# Patient Record
Sex: Male | Born: 1940 | Race: White | Hispanic: No | Marital: Married | Smoking: Never smoker
Health system: Southern US, Community
[De-identification: ages and names within clinical notes are randomized; demographics above are authoritative.]

## PROBLEM LIST (undated history)

## (undated) DIAGNOSIS — G4730 Sleep apnea, unspecified: Secondary | ICD-10-CM

## (undated) DIAGNOSIS — I10 Essential (primary) hypertension: Secondary | ICD-10-CM

## (undated) DIAGNOSIS — E669 Obesity, unspecified: Secondary | ICD-10-CM

## (undated) DIAGNOSIS — C801 Malignant (primary) neoplasm, unspecified: Secondary | ICD-10-CM

## (undated) DIAGNOSIS — E119 Type 2 diabetes mellitus without complications: Secondary | ICD-10-CM

## (undated) HISTORY — PX: KNEE SURGERY: SHX244

## (undated) HISTORY — PX: ADENOIDECTOMY: SUR15

## (undated) HISTORY — PX: PROSTATECTOMY: SHX69

---

## 2012-07-19 DIAGNOSIS — Z86018 Personal history of other benign neoplasm: Secondary | ICD-10-CM

## 2012-07-19 HISTORY — DX: Personal history of other benign neoplasm: Z86.018

## 2013-11-19 LAB — CBC WITH DIFFERENTIAL/PLATELET
Basophil #: 0.1 10*3/uL (ref 0.0–0.1)
Basophil %: 0.5 %
EOS ABS: 0.1 10*3/uL (ref 0.0–0.7)
Eosinophil %: 0.4 %
HCT: 45.5 % (ref 40.0–52.0)
HGB: 15.5 g/dL (ref 13.0–18.0)
Lymphocyte #: 2.2 10*3/uL (ref 1.0–3.6)
Lymphocyte %: 19.3 %
MCH: 32.5 pg (ref 26.0–34.0)
MCHC: 34 g/dL (ref 32.0–36.0)
MCV: 96 fL (ref 80–100)
Monocyte #: 1 x10 3/mm (ref 0.2–1.0)
Monocyte %: 8.3 %
Neutrophil #: 8.3 10*3/uL — ABNORMAL HIGH (ref 1.4–6.5)
Neutrophil %: 71.5 %
Platelet: 185 10*3/uL (ref 150–440)
RBC: 4.76 10*6/uL (ref 4.40–5.90)
RDW: 13.1 % (ref 11.5–14.5)
WBC: 11.6 10*3/uL — AB (ref 3.8–10.6)

## 2013-11-19 LAB — BASIC METABOLIC PANEL
Anion Gap: 8 (ref 7–16)
BUN: 12 mg/dL (ref 7–18)
CALCIUM: 8.8 mg/dL (ref 8.5–10.1)
CHLORIDE: 103 mmol/L (ref 98–107)
Co2: 29 mmol/L (ref 21–32)
Creatinine: 0.92 mg/dL (ref 0.60–1.30)
EGFR (African American): >60
EGFR (Non-African Amer.): >60
GLUCOSE: 147 mg/dL — AB (ref 65–99)
OSMOLALITY: 282 (ref 275–301)
Potassium: 3.3 mmol/L — ABNORMAL LOW (ref 3.5–5.1)
Sodium: 140 mmol/L (ref 136–145)

## 2013-11-19 LAB — URINALYSIS, COMPLETE
BACTERIA: NONE SEEN
Bilirubin,UR: NEGATIVE
Blood: NEGATIVE
Glucose,UR: NEGATIVE mg/dL (ref 0–75)
LEUKOCYTE ESTERASE: NEGATIVE
Nitrite: NEGATIVE
Ph: 5 (ref 4.5–8.0)
RBC,UR: 4 /HPF (ref 0–5)
SPECIFIC GRAVITY: 1.028 (ref 1.003–1.030)
WBC UR: 2 /HPF (ref 0–5)

## 2013-11-20 LAB — BASIC METABOLIC PANEL
Anion Gap: 7 (ref 7–16)
BUN: 15 mg/dL (ref 7–18)
CALCIUM: 8.7 mg/dL (ref 8.5–10.1)
CO2: 29 mmol/L (ref 21–32)
Chloride: 104 mmol/L (ref 98–107)
Creatinine: 0.87 mg/dL (ref 0.60–1.30)
EGFR (African American): >60
EGFR (Non-African Amer.): >60
Glucose: 149 mg/dL — ABNORMAL HIGH (ref 65–99)
OSMOLALITY: 283 (ref 275–301)
Potassium: 3.4 mmol/L — ABNORMAL LOW (ref 3.5–5.1)
Sodium: 140 mmol/L (ref 136–145)

## 2013-11-20 LAB — CBC WITH DIFFERENTIAL/PLATELET
BASOS PCT: 0.5 %
Basophil #: 0 10*3/uL (ref 0.0–0.1)
EOS PCT: 0.8 %
Eosinophil #: 0.1 10*3/uL (ref 0.0–0.7)
HCT: 42.2 % (ref 40.0–52.0)
HGB: 14.6 g/dL (ref 13.0–18.0)
LYMPHS ABS: 1.6 10*3/uL (ref 1.0–3.6)
LYMPHS PCT: 17.8 %
MCH: 33 pg (ref 26.0–34.0)
MCHC: 34.6 g/dL (ref 32.0–36.0)
MCV: 95 fL (ref 80–100)
MONO ABS: 0.9 x10 3/mm (ref 0.2–1.0)
Monocyte %: 9.8 %
NEUTROS ABS: 6.6 10*3/uL — AB (ref 1.4–6.5)
NEUTROS PCT: 71.1 %
PLATELETS: 174 10*3/uL (ref 150–440)
RBC: 4.43 10*6/uL (ref 4.40–5.90)
RDW: 13 % (ref 11.5–14.5)
WBC: 9.3 10*3/uL (ref 3.8–10.6)

## 2013-11-21 ENCOUNTER — Inpatient Hospital Stay: Payer: Self-pay | Admitting: Internal Medicine

## 2013-11-24 LAB — CREATININE, SERUM
Creatinine: 0.81 mg/dL (ref 0.60–1.30)
EGFR (African American): >60
EGFR (Non-African Amer.): >60

## 2014-01-24 ENCOUNTER — Encounter: Payer: Self-pay | Admitting: Orthopedic Surgery

## 2014-01-30 ENCOUNTER — Encounter: Payer: Self-pay | Admitting: Orthopedic Surgery

## 2014-03-02 ENCOUNTER — Encounter: Payer: Self-pay | Admitting: Orthopedic Surgery

## 2014-04-02 ENCOUNTER — Encounter: Payer: Self-pay | Admitting: Orthopedic Surgery

## 2014-05-01 ENCOUNTER — Encounter: Payer: Self-pay | Admitting: Orthopedic Surgery

## 2014-06-23 NOTE — H&P (Signed)
PATIENT NAME:  Todd Gordon, Todd Gordon MR#:  [id number] DATE OF BIRTH:  [birth year]  DATE OF ADMISSION:  11/19/2013.  REFERRING PHYSICIAN:  Wells Guiles L. Reita Cliche, MD.   FAMILY PHYSICIAN:  Ocie Cornfield. Ouida Sills, MD.   REASON FOR ADMISSION:  Bilateral lower extremity trauma with inability to bear weight.   HISTORY OF PRESENT ILLNESS:  The patient is a 74 year old male with a history of hypertension followed by Dr. Ouida Sills. The patient was out of town, he tripped over some steps and fell, injuring both legs. He presented to a [facility] where he was noted to have right knee trauma and a left ankle fracture. He was observed there overnight and discharged the following day. He subsequently got in his car in [place], [place] and drove straight to [facility]. He states that he cannot walk or care for himself at home. He is unable to bear weight. He complains of severe right knee and left ankle pain. He is now admitted for further evaluation. He denies chest pain or shortness of breath. No palpitations or dizziness. No syncope or presyncope.   PAST MEDICAL HISTORY:  1.  Obesity.  2.  Benign hypertension.  3.  Chronic constipation.   MEDICATIONS:  1.  Spironolactone 25 mg p.o. daily.  2.  Oxycodone 5 mg p.o. q. 4 hours p.r.n. pain.  3.  Hyzaar 100/25 mg 1 p.o. daily.  4.  Coreg 6.25 mg p.o. b.i.d.   ALLERGIES:  No known drug allergies.   SOCIAL HISTORY:  Negative for alcohol or tobacco abuse.   FAMILY HISTORY:  Positive for hypertension and stroke.   REVIEW OF SYSTEMS: CONSTITUTIONAL:  No fever or change in weight.  EYES: No blurred or double vision. No glaucoma.  ENT: No tinnitus or hearing loss. No nasal discharge or bleeding. No difficulty swallowing.  RESPIRATORY: No cough or wheezing. Denies hemoptysis.  CARDIOVASCULAR: No chest pain or orthopnea. No palpitations or syncope.  GASTROINTESTINAL: No nausea, vomiting, or diarrhea. No abdominal pain. No change in bowel habits.   GENITOURINARY: No dysuria or hematuria. No incontinence.  ENDOCRINE: No polyuria or polydipsia. No heat or cold intolerance.  HEMATOLOGIC: The patient denies anemia, easy bruising, or bleeding.  LYMPHATIC: No swollen glands.  MUSCULOSKELETAL: The patient complains of pain in his left ankle and right knee. Denies neck, back, shoulder, or hip pain. No gout.  NEUROLOGIC: No numbness or migraines. Denies stroke or seizures.  PSYCHOLOGICAL: The patient denies anxiety, insomnia or depression.   PHYSICAL EXAMINATION:  GENERAL: The patient is obese, in no acute distress.  VITAL SIGNS: Currently remarkable for a blood pressure of 138/71 with a heart rate of 71, respiratory rate of 18, temperature of 98.1, saturating 96% on room air.  HEENT: Normocephalic, atraumatic. Pupils equally round and reactive to light and accommodation. Extraocular movements are intact. Sclerae are anicteric. Conjunctivae are clear.  Oropharynx is clear.  NECK: Supple without JVD. No adenopathy or thyromegaly is noted.  LUNGS: Clear to auscultation and percussion without wheezes, rales or rhonchi. No dullness. Respiratory effort is normal.  CARDIAC: Regular rate and rhythm. Normal S1, S2. No significant rubs, murmurs or gallops. PMI is nondisplaced.  CHEST WALL: Nontender.  ABDOMEN: Soft, nontender, with normoactive bowel sounds. No organomegaly or masses were appreciated. No hernias or bruits were noted.  EXTREMITIES: Without obvious edema. The left lower extremity was in makeshift cast and  the right lower extremity was in a knee brace. There is some swelling and some excoriations to the right knee  noted.  NEUROLOGIC: Cranial nerves II-XII grossly intact. Deep tendon reflexes were symmetric. Motor and sensory exam is nonfocal.  PSYCHIATRIC: The patient is alert and oriented to person, place, and time. He was cooperative and used good judgment.   LABORATORY DATA: CBC revealed a white count of 11.6 with hemoglobin of 15.2.  Chemistries reveal a glucose of 147 with BUN of 12, creatinine 0.92 and a potassium of 3.3 with a GFR of greater than 60.   Urinalysis was unremarkable.   X-rays of left ankle revealed a malleolar fracture and x-rays of the right knee revealed a large, presumably traumatic effusion to the right knee.   ASSESSMENT:  1.  Bilateral lower extremity trauma with non-weightbearing status and inability to walk.  2.  Left ankle fracture.  3.  Traumatic right knee effusion.  4.  Hypokalemia.  5.  Obesity.  6.  Benign hypertension.   PLAN: The patient will be observed on orthopedics. We will leave the brace and the cast in place for now. We will consult orthopedics urgently. We will consult care management and social work for discharge planning. We will continue Percocet for pain. We will continue his blood pressure medications. Will supplement his potassium. Followup routine labs in the morning. Further treatment and evaluation will depend upon the patient's progress.   Total time spent on this patient: 45 minutes.    ____________________________ Leonie Douglas Doy Hutching, MD jds:lt D: 11/19/2013 10:55:32 ET T: 11/19/2013 11:15:09 ET JOB#: [id number]  cc: Leonie Douglas. Doy Hutching, MD, <Dictator>             Ocie Cornfield. Ouida Sills, MD Keondre Markson Lennice Sites MD ELECTRONICALLY SIGNED 11/20/2013 12:09

## 2014-06-23 NOTE — Discharge Summary (Signed)
PATIENT NAME:  Todd Gordon, Todd Gordon MR#:  [id number] DATE OF BIRTH:  [birth year]  DATE OF ADMISSION:  11/21/2013 DATE OF DISCHARGE:  11/24/2013  DISCHARGE DIAGNOSES: 1.  Inability to walk secondary to trauma, both legs. Specific diagnosis and type of trauma per orthopedics. Please see their notes for details.  2.  Hypertension, controlled.   DISCHARGE MEDICATIONS: Per [facility] med reconciliation system. Please see for details. Basically will be on aspirin 325 b.i.d., senna, Percocet for pain and his usual home regimen otherwise.  HISTORY AND PHYSICAL: Please see detailed history and physical done on admission.   HOSPITAL COURSE: The patient was admitted with bilateral leg trauma. Initially orthopedics said he would not require surgery, did not need admission. As he could not walk, he was admitted to the medicine service. Orthopedics operated on his right leg for trauma as noted. Again, please see their notes for further details on type of surgery and specific diagnosis for that. He seemed to tolerate the surgery well. Pain was controlled with oxycodone. He had some constipation that was relieved with senna, lactulose and MiraLax. He started on PT. Due to the fact that he lived alone, could not ambulate or even transfer very well, and had no access to enter his home through the steps, rehab was recommended by all involved. He will be discharged there to follow up with me post discharge as needed, within a few months at least.  ____________________________ Ocie Cornfield. Ouida Sills, MD mwa:sb D: 11/24/2013 07:45:13 ET T: 11/24/2013 08:01:32 ET JOB#: [id number]  cc: Ocie Cornfield. Ouida Sills, MD, <Dictator> Kirk Ruths MD ELECTRONICALLY SIGNED 11/28/2013 8:24

## 2014-06-23 NOTE — Op Note (Signed)
PATIENT NAME:  Todd Gordon, Todd Gordon MR#:  [id number] DATE OF BIRTH:  1940/08/28  DATE OF PROCEDURE:  11/21/2013  PREOPERATIVE DIAGNOSIS: Right quadriceps rupture, acute.   POSTOPERATIVE DIAGNOSIS: Right quadriceps rupture, acute.   PROCEDURE: Right quadriceps repair.   ANESTHESIA: General.   SURGEON: Laurene Footman, MD   DESCRIPTION OF PROCEDURE: The patient was brought to the operating room, and after adequate anesthesia was obtained, the right leg was prepped and draped in the usual sterile fashion. After patient identification and timeout procedures were completed, the knee was approached with an anterior approach centered over the distal quadriceps with a palpable defect. Tourniquet was not required. Hemostasis was achieved with electrocautery. After incision was made, the joint was irrigated. There was a large amount of clot present, with complete rupture of the quadriceps off of the patella. The complete quadriceps tear was identified and the portion of this tendon debrided. The patella was drilled through 2 different holes medial and lateral, and sutures were placed through the tendon with a FiberTape. After these FiberTapes had been placed with Krakow-type suture and pulled to make sure that they would maintain and not pull out of the quadriceps, the sutures were passed through the SwiveLock suture anchor. The SwiveLock anchor was then sunk into the bone, bringing the tendon down directly to the bone. There was anatomic repair. The remaining suture off this was then used to reinforce the repair and get additional fixation to the quadriceps. A running 0 Vicryl was then used to repair the capsule medial and lateral. The wound was closed with subcutaneous 2-0 Vicryl and skin staples with local anesthetic, with Xeroform, 4 x 4's, Webril, and Ace wrap applied along with a knee brace locked in extension.   ESTIMATED BLOOD LOSS: 100 mL.   COMPLICATIONS: None.   SPECIMEN: None.  IMPLANTS: Arthrex  SwiveLock internal brace knee ligament augmentation repair system.   ____________________________ Laurene Footman, MD mjm:ST D: 11/21/2013 21:30:56 ET T: 11/21/2013 22:15:27 ET JOB#: [id number]  cc: Laurene Footman, MD, <Dictator> Laurene Footman MD ELECTRONICALLY SIGNED 11/22/2013 7:17

## 2014-06-23 NOTE — Consult Note (Signed)
Brief Consult Note: Diagnosis: possible right quadriceps tear, avulsion medial mallelus.   Patient was seen by consultant.   Recommend further assessment or treatment.   Orders entered.   Comments: needs MRI right knee for probable quad tear, will need surgery if positive. left ankle should not require treatmetn other than Ace wrap or bracing.  Electronic Signatures: Laurene Footman (MD)  (Signed 20-Sep-15 16:10)  Authored: Brief Consult Note   Last Updated: 20-Sep-15 16:10 by Laurene Footman (MD)

## 2015-05-22 ENCOUNTER — Encounter: Payer: Self-pay | Admitting: *Deleted

## 2015-05-23 ENCOUNTER — Ambulatory Visit
Admission: RE | Admit: 2015-05-23 | Discharge: 2015-05-23 | Disposition: A | Discharge status: Home / Self Care | Payer: Medicare Other | Source: Ambulatory Visit | Attending: Gastroenterology | Admitting: Gastroenterology

## 2015-05-23 ENCOUNTER — Ambulatory Visit: Payer: Medicare Other | Admitting: Anesthesiology

## 2015-05-23 ENCOUNTER — Encounter: Payer: Self-pay | Admitting: *Deleted

## 2015-05-23 ENCOUNTER — Encounter
Admission: RE | Disposition: A | Discharge status: Home / Self Care | Payer: Self-pay | Source: Ambulatory Visit | Attending: Gastroenterology

## 2015-05-23 DIAGNOSIS — I10 Essential (primary) hypertension: Secondary | ICD-10-CM | POA: Diagnosis not present

## 2015-05-23 DIAGNOSIS — E119 Type 2 diabetes mellitus without complications: Secondary | ICD-10-CM | POA: Insufficient documentation

## 2015-05-23 DIAGNOSIS — G4730 Sleep apnea, unspecified: Secondary | ICD-10-CM | POA: Insufficient documentation

## 2015-05-23 DIAGNOSIS — Z9889 Other specified postprocedural states: Secondary | ICD-10-CM | POA: Insufficient documentation

## 2015-05-23 DIAGNOSIS — Z7951 Long term (current) use of inhaled steroids: Secondary | ICD-10-CM | POA: Diagnosis not present

## 2015-05-23 DIAGNOSIS — E669 Obesity, unspecified: Secondary | ICD-10-CM | POA: Diagnosis not present

## 2015-05-23 DIAGNOSIS — K5730 Diverticulosis of large intestine without perforation or abscess without bleeding: Secondary | ICD-10-CM | POA: Insufficient documentation

## 2015-05-23 DIAGNOSIS — Z8546 Personal history of malignant neoplasm of prostate: Secondary | ICD-10-CM | POA: Diagnosis not present

## 2015-05-23 DIAGNOSIS — Z79899 Other long term (current) drug therapy: Secondary | ICD-10-CM | POA: Diagnosis not present

## 2015-05-23 DIAGNOSIS — Z1211 Encounter for screening for malignant neoplasm of colon: Secondary | ICD-10-CM | POA: Diagnosis not present

## 2015-05-23 DIAGNOSIS — Z9079 Acquired absence of other genital organ(s): Secondary | ICD-10-CM | POA: Diagnosis not present

## 2015-05-23 DIAGNOSIS — Z6835 Body mass index (BMI) 35.0-35.9, adult: Secondary | ICD-10-CM | POA: Diagnosis not present

## 2015-05-23 HISTORY — PX: COLONOSCOPY WITH PROPOFOL: SHX5780

## 2015-05-23 HISTORY — DX: Obesity, unspecified: E66.9

## 2015-05-23 HISTORY — DX: Type 2 diabetes mellitus without complications: E11.9

## 2015-05-23 HISTORY — DX: Essential (primary) hypertension: I10

## 2015-05-23 HISTORY — DX: Malignant (primary) neoplasm, unspecified: C80.1

## 2015-05-23 HISTORY — DX: Sleep apnea, unspecified: G47.30

## 2015-05-23 SURGERY — COLONOSCOPY WITH PROPOFOL
Anesthesia: General

## 2015-05-23 MED ORDER — CARVEDILOL 6.25 MG PO TABS
6.2500 mg | ORAL_TABLET | Freq: Once | ORAL | Status: AC
  Administered 2015-05-23: 6.25 mg via ORAL

## 2015-05-23 MED ORDER — CARVEDILOL 6.25 MG PO TABS
ORAL_TABLET | ORAL | Status: AC
  Administered 2015-05-23: 6.25 mg via ORAL
  Filled 2015-05-23: qty 1

## 2015-05-23 MED ORDER — PROPOFOL 10 MG/ML IV BOLUS
INTRAVENOUS | Status: DC | PRN
  Administered 2015-05-23: 90 mg via INTRAVENOUS

## 2015-05-23 MED ORDER — SODIUM CHLORIDE 0.9 % IV SOLN
INTRAVENOUS | Status: DC
  Administered 2015-05-23: 10:00:00 via INTRAVENOUS

## 2015-05-23 MED ORDER — PROPOFOL 500 MG/50ML IV EMUL
INTRAVENOUS | Status: DC | PRN
  Administered 2015-05-23: 160 ug/kg/min via INTRAVENOUS

## 2015-05-23 MED ORDER — SODIUM CHLORIDE 0.9 % IV SOLN: INTRAVENOUS | Status: DC

## 2015-05-23 NOTE — Op Note (Signed)
[facility] Patient Name: Todd Gordon Procedure Date: 05/23/2015 10:22 AM MRN: [id number] Account #: [id number] Date of Birth: May 06, 1940 Admit Type: Outpatient Age: 75 Room: [facility] ENDO ROOM 4 Gender: Male Note Status: Finalized Procedure:            Colonoscopy Indications:          Screening for colorectal malignant neoplasm Providers:            Lupita Dawn. Candace Cruise, MD Referring MD:         Ocie Cornfield. Ouida Sills, MD (Referring MD) Medicines:            Monitored Anesthesia Care Complications:        No immediate complications. Procedure:            Pre-Anesthesia Assessment:                       - Prior to the procedure, a History and Physical was                        performed, and patient medications, allergies and                        sensitivities were reviewed. The patient's tolerance of                        previous anesthesia was reviewed.                       - The risks and benefits of the procedure and the                        sedation options and risks were discussed with the                        patient. All questions were answered and informed                        consent was obtained.                       - After reviewing the risks and benefits, the patient                        was deemed in satisfactory condition to undergo the                        procedure.                       After obtaining informed consent, the colonoscope was                        passed under direct vision. Throughout the procedure,                        the patient's blood pressure, pulse, and oxygen                        saturations were monitored continuously. The Olympus  CF-H180AL colonoscope ( S#: [id number] ) was introduced                        through the anus and advanced to the the cecum,                        identified by appendiceal orifice and ileocecal valve.                        The colonoscopy was  performed without difficulty. The                        patient tolerated the procedure well. The quality of                        the bowel preparation was fair. Findings:      A few small and large-mouthed diverticula were found in the sigmoid       colon.      The exam was otherwise without abnormality. Impression:           - Preparation of the colon was fair.                       - Diverticulosis in the sigmoid colon.                       - The examination was otherwise normal.                       - No specimens collected. Recommendation:       - Discharge patient to home.                       - The findings and recommendations were discussed with                        the patient. Procedure Code(s):    --- Professional ---                       [phone], Colonoscopy, flexible; diagnostic, including                        collection of specimen(s) by brushing or washing, when                        performed (separate procedure) Diagnosis Code(s):    --- Professional ---                       Z12.11, Encounter for screening for malignant neoplasm                        of colon                       K57.30, Diverticulosis of large intestine without                        perforation or abscess without bleeding CPT copyright 2016 American Medical Association. All rights reserved. The codes documented in this report are preliminary and upon coder review may  be revised to meet current  compliance requirements. Hulen Luster, MD 05/23/2015 10:39:14 AM This report has been signed electronically. Number of Addenda: 0 Note Initiated On: 05/23/2015 10:22 AM Scope Withdrawal Time: 0 hours 7 minutes 29 seconds  Total Procedure Duration: 0 hours 10 minutes 25 seconds       [facility]

## 2015-05-23 NOTE — Anesthesia Postprocedure Evaluation (Signed)
Anesthesia Post Note  Patient: Todd Gordon  Procedure(s) Performed: Procedure(s) (LRB): COLONOSCOPY WITH PROPOFOL (N/A)  Patient location during evaluation: PACU Anesthesia Type: General Level of consciousness: awake and alert Pain management: pain level controlled Vital Signs Assessment: post-procedure vital signs reviewed and stable Respiratory status: spontaneous breathing and respiratory function stable Cardiovascular status: stable Anesthetic complications: no    Last Vitals:  Filed Vitals:   05/23/15 1100 05/23/15 1110  BP: 155/84 155/91  Pulse: 54 54  Temp:    Resp: 24 17    Last Pain:  Filed Vitals:   05/23/15 1133  PainSc: 2                  KEPHART,WILLIAM K

## 2015-05-23 NOTE — Transfer of Care (Signed)
Immediate Anesthesia Transfer of Care Note  Patient: Todd Gordon  Procedure(s) Performed: Procedure(s): COLONOSCOPY WITH PROPOFOL (N/A)  Patient Location: PACU and Endoscopy Unit  Anesthesia Type:General  Level of Consciousness: lethargic and responds to stimulation  Airway & Oxygen Therapy: Patient Spontanous Breathing and Patient connected to nasal cannula oxygen  Post-op Assessment: Report given to RN and Post -op Vital signs reviewed and stable  Post vital signs: Reviewed and stable  Last Vitals:  Filed Vitals:   05/23/15 0924 05/23/15 1043  BP: 148/95 118/59  Pulse: 63 58  Temp: 35.9 C 36.4 C  Resp: 16 12    Complications: No apparent anesthesia complications

## 2015-05-23 NOTE — H&P (Signed)
    Primary Care Physician:  Kirk Ruths., MD Primary Gastroenterologist:  Dr. Candace Cruise  Pre-Procedure History & Physical: HPI:  Todd Gordon is a 75 y.o. male is here for an colonoscopy.   Past Medical History  Diagnosis Date  . Diabetes mellitus without complication ([place])   . Hypertension   . Obesity   . Sleep apnea   . Cancer [facility])     prostate cancer    Past Surgical History  Procedure Laterality Date  . Prostatectomy    . Knee surgery Right     patella tendon rupture repair  . Adenoidectomy      Prior to Admission medications   Medication Sig Start Date End Date Taking? Authorizing Provider  amoxicillin (AMOXIL) 500 MG capsule Take 500 mg by mouth 3 (three) times daily.   Yes Historical Provider, MD  carvedilol (COREG) 6.25 MG tablet Take 6.25 mg by mouth 2 (two) times daily with a meal.   Yes Historical Provider, MD  chlorhexidine (PERIDEX) 0.12 % solution Use as directed 15 mLs in the mouth or throat 2 (two) times daily.   Yes Historical Provider, MD  fluticasone (FLONASE) 50 MCG/ACT nasal spray Place into both nostrils daily.   Yes Historical Provider, MD  furosemide (LASIX) 20 MG tablet Take 20 mg by mouth.   Yes Historical Provider, MD  losartan-hydrochlorothiazide (HYZAAR) 100-25 MG tablet Take 1 tablet by mouth daily.   Yes Historical Provider, MD  lovastatin (MEVACOR) 20 MG tablet Take 20 mg by mouth at bedtime.   Yes Historical Provider, MD  spironolactone (ALDACTONE) 25 MG tablet Take 25 mg by mouth daily.   Yes Historical Provider, MD    Allergies as of 05/10/2015  . (Not on File)    History reviewed. No pertinent family history.  Social History   Social History  . Marital Status: Married    Spouse Name: N/A  . Number of Children: N/A  . Years of Education: N/A   Occupational History  . Not on file.   Social History Main Topics  . Smoking status: Never Smoker   . Smokeless tobacco: Not on file  . Alcohol Use: No  . Drug Use: No  .  Sexual Activity: Not on file   Other Topics Concern  . Not on file   Social History Narrative    Review of Systems: See HPI, otherwise negative ROS  Physical Exam: BP 148/95 mmHg  Pulse 63  Temp(Src) 96.7 F (35.9 C) (Tympanic)  Resp 16  Ht 6\' 1"  (1.854 m)  Wt 122.471 kg (270 lb)  BMI 35.63 kg/m2  SpO2 100% General:   Alert,  pleasant and cooperative in NAD Head:  Normocephalic and atraumatic. Neck:  Supple; no masses or thyromegaly. Lungs:  Clear throughout to auscultation.    Heart:  Regular rate and rhythm. Abdomen:  Soft, nontender and nondistended. Normal bowel sounds, without guarding, and without rebound.   Neurologic:  Alert and  oriented x4;  grossly normal neurologically.  Impression/Plan: Todd Gordon is here for an colonoscopy to be performed for screening  Risks, benefits, limitations, and alternatives regarding  colonoscopy have been reviewed with the patient.  Questions have been answered.  All parties agreeable.   Anna Livers, Lupita Dawn, MD  05/23/2015, 9:49 AM

## 2015-05-23 NOTE — Anesthesia Preprocedure Evaluation (Signed)
Anesthesia Evaluation  Patient identified by MRN, date of birth, ID band Patient awake    Reviewed: Allergy & Precautions, NPO status , Patient's Chart, lab work & pertinent test results  History of Anesthesia Complications Negative for: history of anesthetic complications  Airway Mallampati: III       Dental  (+) Missing   Pulmonary neg pulmonary ROS, sleep apnea and Continuous Positive Airway Pressure Ventilation ,           Cardiovascular hypertension, Pt. on medications and Pt. on home beta blockers      Neuro/Psych negative neurological ROS     GI/Hepatic negative GI ROS, Neg liver ROS,   Endo/Other  diabetes (borderline)  Renal/GU negative Renal ROS     Musculoskeletal   Abdominal   Peds  Hematology negative hematology ROS (+)   Anesthesia Other Findings   Reproductive/Obstetrics                             Anesthesia Physical Anesthesia Plan  ASA: III  Anesthesia Plan: General   Post-op Pain Management:    Induction: Intravenous  Airway Management Planned: Nasal Cannula  Additional Equipment:   Intra-op Plan:   Post-operative Plan:   Informed Consent: I have reviewed the patients History and Physical, chart, labs and discussed the procedure including the risks, benefits and alternatives for the proposed anesthesia with the patient or authorized representative who has indicated his/her understanding and acceptance.     Plan Discussed with:   Anesthesia Plan Comments:         Anesthesia Quick Evaluation

## 2016-02-05 IMAGING — MR MRI OF THE RIGHT KNEE WITHOUT CONTRAST
7 series · 39 of 40 positions shown · non-contrast
Comparison: None.

CLINICAL DATA: Status post fall 4 days ago. Right knee pain and
swelling, worst about the patella.

EXAM:
MRI OF THE RIGHT KNEE WITHOUT CONTRAST
TECHNIQUE: Multiplanar, multisequence MR imaging of the knee was performed. No
intravenous contrast was administered.

[Series 100: PD · axial · 3.0mm · 0.50mm/px · z∈[-113,+19]mm · 7 of 41 slices shown (1 of 3)]
[im 1/41]
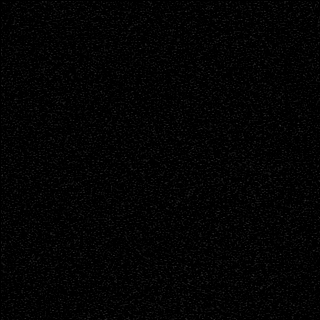
[im 7/41]
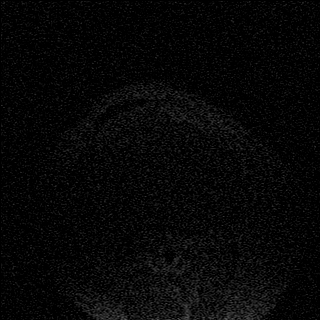
[im 14/41]
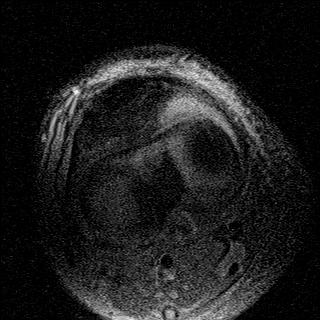
[im 21/41]
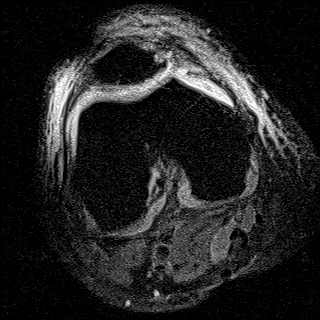
[im 27/41]
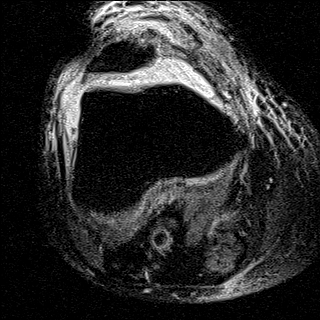
[im 34/41]
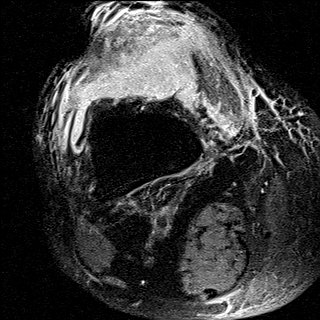
[im 41/41]
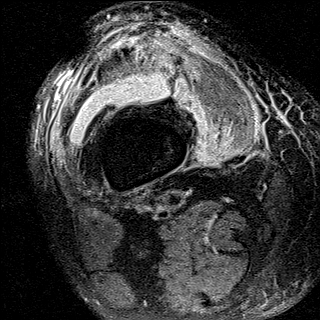

[Series 101: coronal t1_fil_1 · coronal · 3.0mm · 0.50mm/px · 6 of 35 slices shown]
[im 1/35]
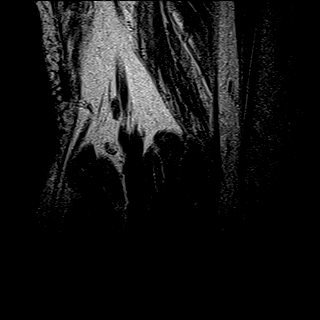
[im 7/35]
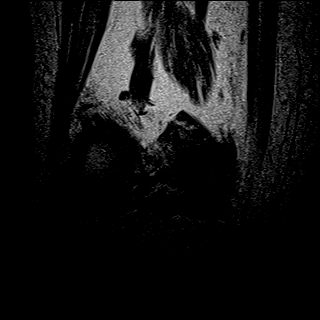
[im 14/35]
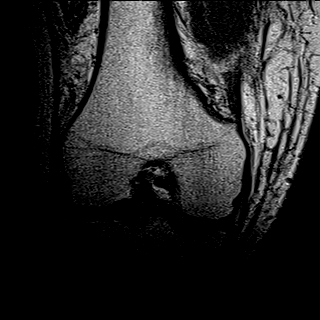
[im 21/35]
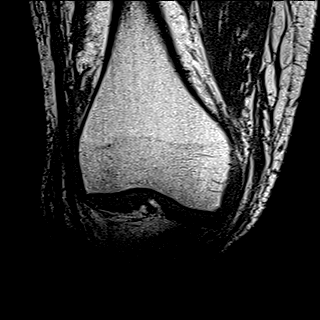
[im 28/35]
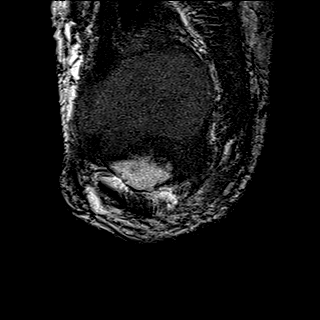
[im 35/35]
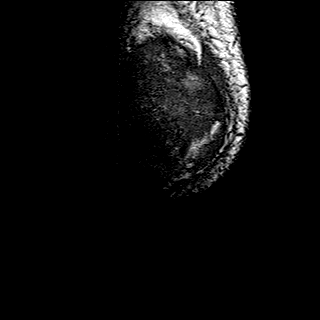

[Series 102: T2 · coronal · 3.0mm · 0.31mm/px · 5 of 37 slices shown]
[im 1/37]
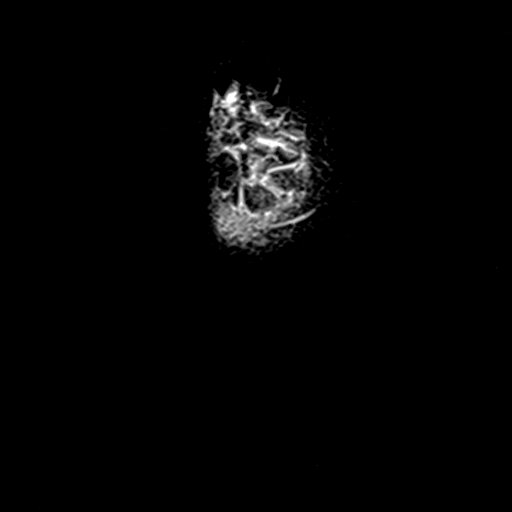
[im 8/37]
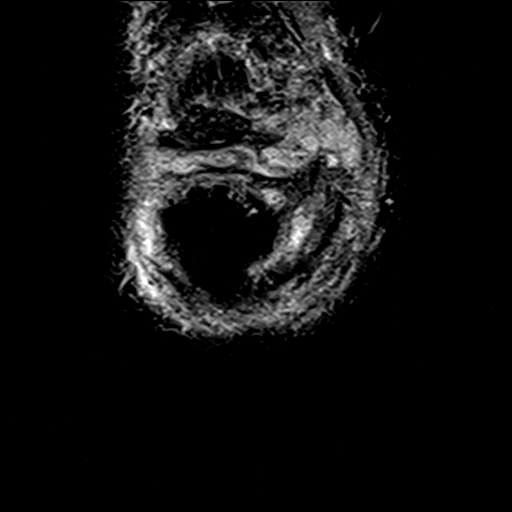
[im 15/37]
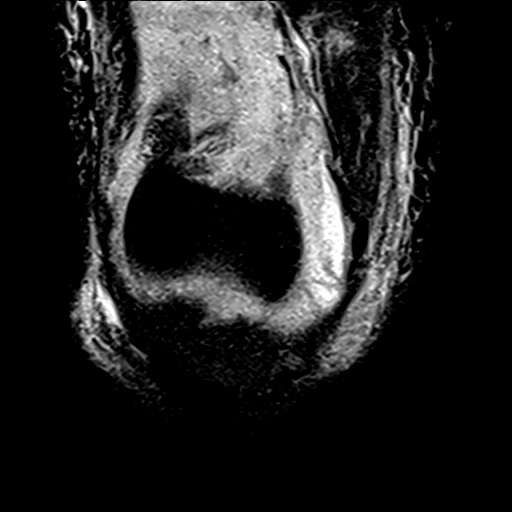
[im 22/37]
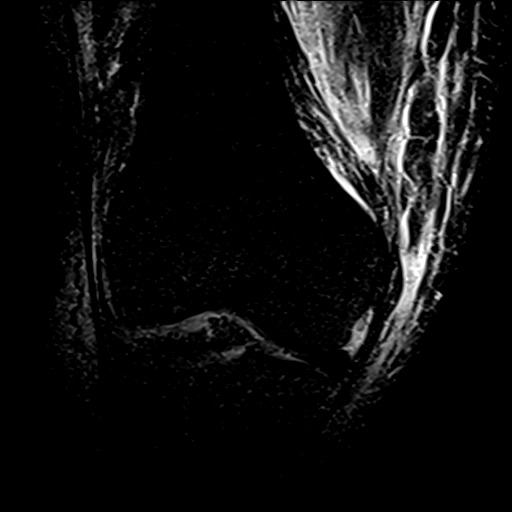
[im 29/37]
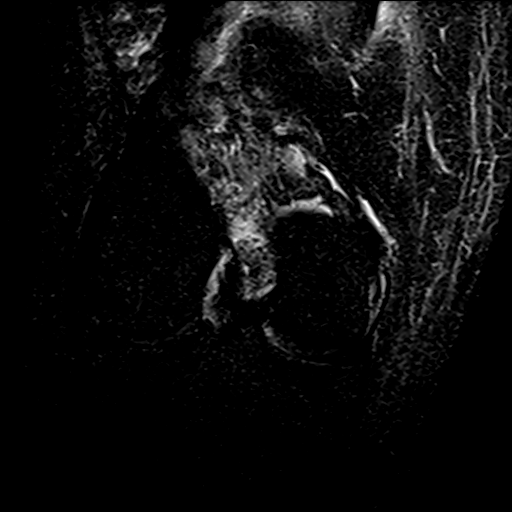

[Series 103: PD · coronal · 3.0mm · 0.50mm/px · 6 of 37 slices shown (2 of 3)]
[im 1/37]
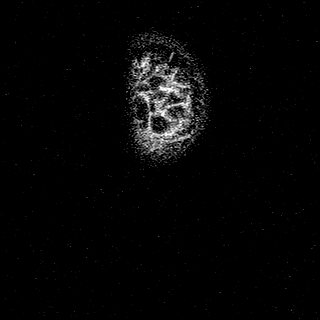
[im 8/37]
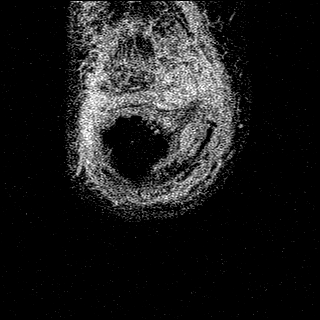
[im 15/37]
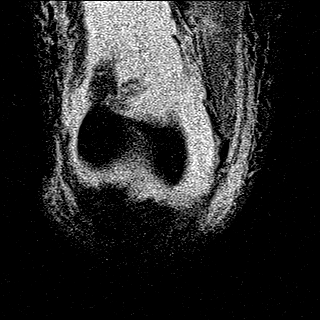
[im 22/37]
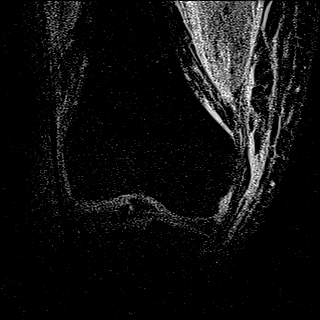
[im 29/37]
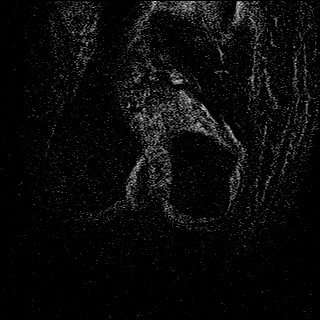
[im 37/37]
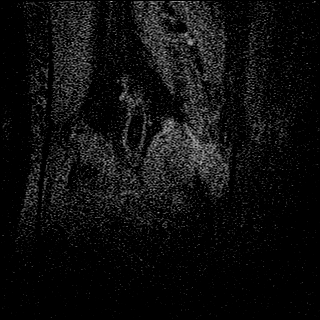

[Series 104: PD · sagittal · 3.0mm · 0.50mm/px · 6 of 38 slices shown (3 of 3)]
[im 1/38]
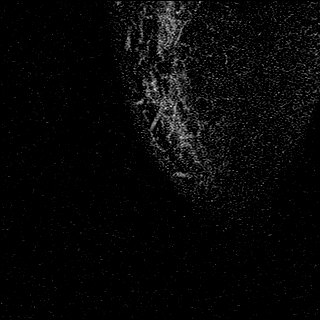
[im 8/38]
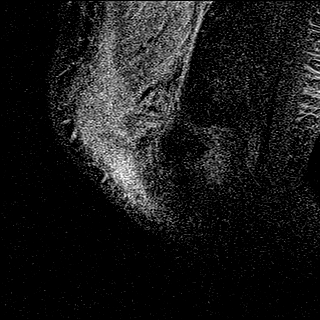
[im 15/38]
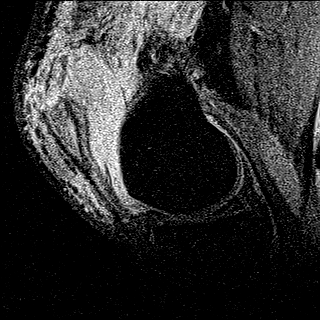
[im 23/38]
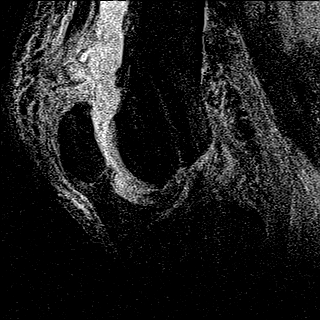
[im 30/38]
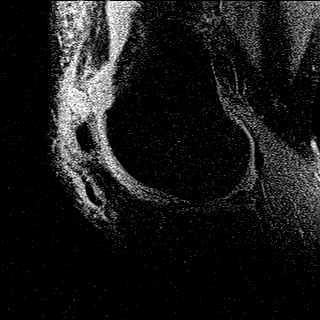
[im 38/38]
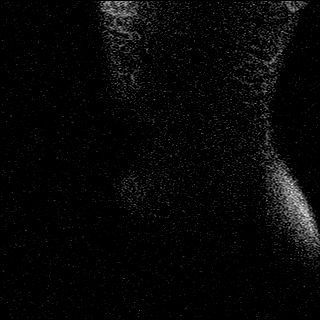

[Series 105: PD fat-sat · axial · 3.0mm · 0.50mm/px · z∈[-114,+18]mm · 7 of 41 slices shown (1 of 2)]
[im 1/41]
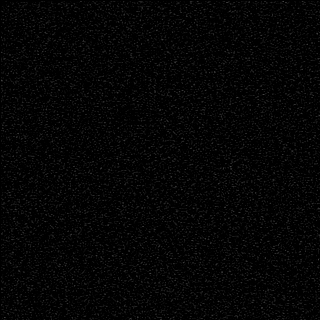
[im 7/41]
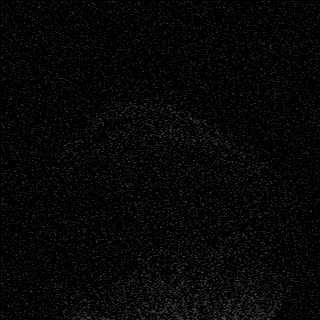
[im 14/41]
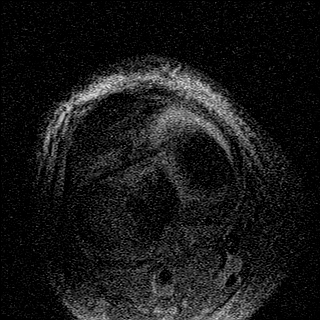
[im 21/41]
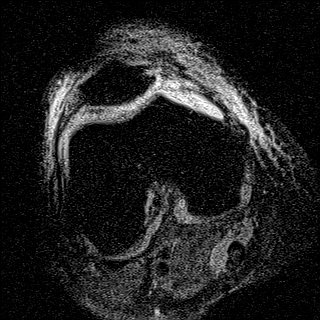
[im 27/41]
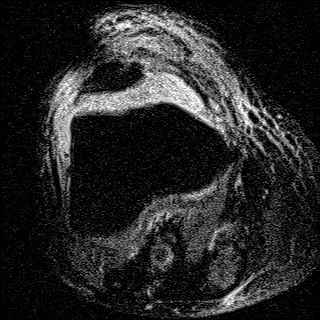
[im 34/41]
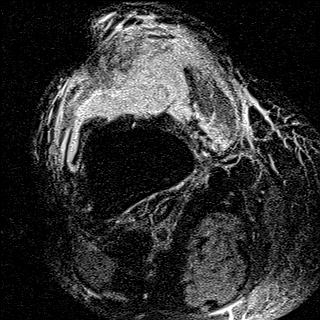
[im 41/41]
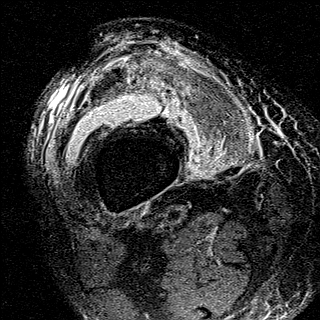

[Series 106: PD fat-sat · oblique · 2.0mm · 0.62mm/px · 2 of 14 slices shown (2 of 2)]
[im 1/14]
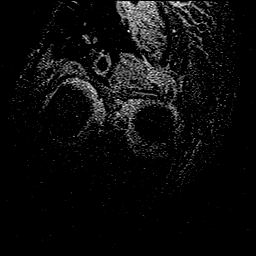
[im 14/14]
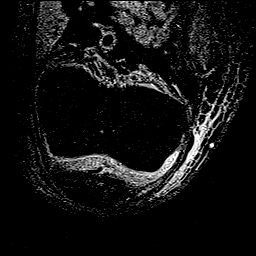

[39 of 40 positions shown; findings below may reference images not displayed]

FINDINGS: The study is somewhat limited as the patient has lower leg fractures
and markedly limited mobility.

MENISCI

Medial meniscus: Although poorly seen, there appears to be an
oblique tear in the posterior horn of the medial meniscus reaching
the meniscal undersurface. No displaced fragment.

Lateral meniscus:  Intact.

LIGAMENTS

Cruciates:  Intact.

Collaterals:  Intact

CARTILAGE

Patellofemoral:  Mildly degenerated.

Medial:  Unremarkable.

Lateral:  Unremarkable.

Joint:  Moderately large joint effusion is present.

Popliteal Fossa: Baker's cyst measures 4.7 cm craniocaudal by 2.4 cm
transverse by 0.7 cm AP.

Extensor Mechanism: The quadriceps tendon is completely torn from
the superior pole of the patella and retracted approximately 3 cm.
Extensive hemorrhage and edema are present about the tear. Although
poorly seen, the patellar tendon appears intact.

Bones:  Unremarkable.
IMPRESSION: Complete quadriceps tendon tear from the superior pole of the
patella with approximately 3 cm of retraction.

Likely oblique tear posterior horn medial meniscus without displaced
fragment.

Small Baker's cyst.

## 2019-10-11 ENCOUNTER — Other Ambulatory Visit: Payer: Self-pay

## 2019-10-11 ENCOUNTER — Ambulatory Visit (INDEPENDENT_AMBULATORY_CARE_PROVIDER_SITE_OTHER): Payer: Medicare Other | Admitting: Dermatology

## 2019-10-11 DIAGNOSIS — L720 Epidermal cyst: Secondary | ICD-10-CM

## 2019-10-11 DIAGNOSIS — Z1283 Encounter for screening for malignant neoplasm of skin: Secondary | ICD-10-CM

## 2019-10-11 DIAGNOSIS — Z86018 Personal history of other benign neoplasm: Secondary | ICD-10-CM

## 2019-10-11 DIAGNOSIS — L570 Actinic keratosis: Secondary | ICD-10-CM | POA: Diagnosis not present

## 2019-10-11 DIAGNOSIS — L814 Other melanin hyperpigmentation: Secondary | ICD-10-CM

## 2019-10-11 DIAGNOSIS — D229 Melanocytic nevi, unspecified: Secondary | ICD-10-CM

## 2019-10-11 DIAGNOSIS — D1800 Hemangioma unspecified site: Secondary | ICD-10-CM

## 2019-10-11 DIAGNOSIS — I872 Venous insufficiency (chronic) (peripheral): Secondary | ICD-10-CM

## 2019-10-11 DIAGNOSIS — L578 Other skin changes due to chronic exposure to nonionizing radiation: Secondary | ICD-10-CM

## 2019-10-11 DIAGNOSIS — L821 Other seborrheic keratosis: Secondary | ICD-10-CM

## 2019-10-11 NOTE — Progress Notes (Signed)
   Follow-Up Visit   Subjective  Todd Gordon is a 79 y.o. male who presents for the following: Annual Exam (Hx dysplastic nevus and AK's ). The patient presents for Total-Body Skin Exam (TBSE) for skin cancer screening and mole check.  The following portions of the chart were reviewed this encounter and updated as appropriate:  Allergies  Meds  Problems  Med Hx  Surg Hx  Fam Hx     Review of Systems:  No other skin or systemic complaints except as noted in HPI or Assessment and Plan.  Objective  Well appearing patient in no apparent distress; mood and affect are within normal limits.  A full examination was performed including scalp, head, eyes, ears, nose, lips, neck, chest, axillae, abdomen, back, buttocks, bilateral upper extremities, bilateral lower extremities, hands, feet, fingers, toes, fingernails, and toenails. All findings within normal limits unless otherwise noted below.  Objective  back x 3: Subcutaneous nodule.   Objective  B/L lower leg: Stasis changes and edema of the L lower leg  Objective  scalp x 10 (10): Erythematous thin papules/macules with gritty scale.   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  Graduated compression stockings recommended.  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  Destruction of lesion - scalp x 10 Complexity: simple   Destruction method: cryotherapy   Informed consent: discussed and consent obtained   Timeout:  patient name, date of birth, surgical site, and procedure verified Lesion destroyed using liquid nitrogen: Yes   Region frozen until ice ball extended beyond lesion: Yes   Outcome: patient tolerated procedure well with no complications   Post-procedure details: wound care instructions given     Lentigines - Scattered tan macules - Discussed due to sun exposure - Benign,  observe - Call for any changes  Seborrheic Keratoses - Stuck-on, waxy, tan-brown papules and plaques  - Discussed benign etiology and prognosis. - Observe - Call for any changes  Melanocytic Nevi - Tan-brown and/or pink-flesh-colored symmetric macules and papules - Benign appearing on exam today - Observation - Call clinic for new or changing moles - Recommend daily use of broad spectrum spf 30+ sunscreen to sun-exposed areas.   Hemangiomas - Red papules - Discussed benign nature - Observe - Call for any changes  Actinic Damage - diffuse scaly erythematous macules with underlying dyspigmentation - Recommend daily broad spectrum sunscreen SPF 30+ to sun-exposed areas, reapply every 2 hours as needed.  - Call for new or changing lesions.  Skin cancer screening performed today.  Return in about 1 year (around 10/10/2020) for TBSE.  Luther Redo, CMA, am acting as scribe for Sarina Ser, MD .  Documentation: I have reviewed the above documentation for accuracy and completeness, and I agree with the above.  Sarina Ser, MD

## 2019-10-16 ENCOUNTER — Encounter: Payer: Self-pay | Admitting: Dermatology

## 2020-10-10 ENCOUNTER — Encounter: Payer: Medicare Other | Admitting: Dermatology

## 2020-10-14 ENCOUNTER — Other Ambulatory Visit: Payer: Self-pay

## 2020-10-14 ENCOUNTER — Ambulatory Visit (INDEPENDENT_AMBULATORY_CARE_PROVIDER_SITE_OTHER): Payer: Medicare Other | Admitting: Dermatology

## 2020-10-14 DIAGNOSIS — L578 Other skin changes due to chronic exposure to nonionizing radiation: Secondary | ICD-10-CM

## 2020-10-14 DIAGNOSIS — L570 Actinic keratosis: Secondary | ICD-10-CM

## 2020-10-14 DIAGNOSIS — L720 Epidermal cyst: Secondary | ICD-10-CM

## 2020-10-14 DIAGNOSIS — L814 Other melanin hyperpigmentation: Secondary | ICD-10-CM | POA: Diagnosis not present

## 2020-10-14 DIAGNOSIS — D229 Melanocytic nevi, unspecified: Secondary | ICD-10-CM

## 2020-10-14 DIAGNOSIS — L0291 Cutaneous abscess, unspecified: Secondary | ICD-10-CM

## 2020-10-14 DIAGNOSIS — Z1283 Encounter for screening for malignant neoplasm of skin: Secondary | ICD-10-CM

## 2020-10-14 DIAGNOSIS — L02212 Cutaneous abscess of back [any part, except buttock]: Secondary | ICD-10-CM | POA: Diagnosis not present

## 2020-10-14 DIAGNOSIS — B356 Tinea cruris: Secondary | ICD-10-CM

## 2020-10-14 DIAGNOSIS — L821 Other seborrheic keratosis: Secondary | ICD-10-CM

## 2020-10-14 DIAGNOSIS — D1800 Hemangioma unspecified site: Secondary | ICD-10-CM

## 2020-10-14 MED ORDER — KETOCONAZOLE 2 % EX CREA: 1.0000 "application " | TOPICAL_CREAM | Freq: Every day | CUTANEOUS | 3 refills | Status: AC

## 2020-10-14 MED ORDER — MUPIROCIN 2 % EX OINT
1.0000 | TOPICAL_OINTMENT | Freq: Every day | CUTANEOUS | 0 refills | Status: AC
Start: 2020-10-14 — End: ?

## 2020-10-14 MED ORDER — DOXYCYCLINE MONOHYDRATE 100 MG PO CAPS
100.0000 mg | ORAL_CAPSULE | Freq: Two times a day (BID) | ORAL | 0 refills | Status: AC
Start: 2020-10-14 — End: 2020-10-24

## 2020-10-14 NOTE — Progress Notes (Signed)
Follow-Up Visit   Subjective  Todd Gordon is a 80 y.o. male who presents for the following: Follow-up (Patient here today for full body exam. Patient states he has spot at back that is inflammed and leaking. Patient states noticed a couple weeks ago. Patient also states he has some spots at forehead that are usually treated with liquid nitrogen. ).  Patient here for full body skin exam and skin cancer screening.  The following portions of the chart were reviewed this encounter and updated as appropriate:  Allergies  Meds  Problems  Med Hx  Surg Hx  Fam Hx     Objective  Well appearing patient in no apparent distress; mood and affect are within normal limits.  A full examination was performed including scalp, head, eyes, ears, nose, lips, neck, chest, axillae, abdomen, back, buttocks, bilateral upper extremities, bilateral lower extremities, hands, feet, fingers, toes, fingernails, and toenails. All findings within normal limits unless otherwise noted below.  upper spinal back 4 cm subcutaneous nodule.   Scalp x 10 (10) Erythematous thin papules/macules with gritty scale.   Assessment & Plan  Epidermal inclusion cyst - inflamed, ruptured and abscessed. upper spinal back Chronic condition with duration or expected duration over one year. Condition is bothersome to patient. Currently flared.  Start mupirocin to affected area daily   Start doxycycline 100 mg by mouth twice daily for 10 days with food   Incision and Drainage - upper spinal back Location: upper spinal back  Informed Consent: Discussed risks (permanent scarring, light or dark discoloration, infection, pain, bleeding, bruising, redness, damage to adjacent structures, and recurrence of the lesion) and benefits of the procedure, as well as the alternatives.  Informed consent was obtained.  Preparation: The area was prepped with alcohol.  Anesthesia: Lidocaine 1% with epinephrine  Complicated I&D Procedure  Details: An incision was made overlying the lesion. The lesion drained pus, white, chalky cyst material, and blood.  A large amount of fluid was drained.    Antibiotic ointment and a sterile pressure dressing were applied. The patient tolerated procedure well.  Total number of lesions drained: 1  Plan: The patient was instructed on post-op care. Recommend OTC analgesia as needed for pain.  doxycycline (MONODOX) 100 MG capsule - upper spinal back Take 1 capsule (100 mg total) by mouth 2 (two) times daily for 10 days. Take with food  mupirocin ointment (BACTROBAN) 2 % - upper spinal back Apply 1 application topically daily. Apply to affected areas until healed  Actinic keratosis (10) Scalp x 10 Actinic keratoses are precancerous spots that appear secondary to cumulative UV radiation exposure/sun exposure over time. They are chronic with expected duration over 1 year. A portion of actinic keratoses will progress to squamous cell carcinoma of the skin. It is not possible to reliably predict which spots will progress to skin cancer and so treatment is recommended to prevent development of skin cancer.  Recommend daily broad spectrum sunscreen SPF 30+ to sun-exposed areas, reapply every 2 hours as needed.  Recommend staying in the shade or wearing long sleeves, sun glasses (UVA+UVB protection) and wide brim hats (4-inch brim around the entire circumference of the hat). Call for new or changing lesions.  Destruction of lesion - Scalp x 10 Complexity: simple   Destruction method: cryotherapy   Informed consent: discussed and consent obtained   Timeout:  patient name, date of birth, surgical site, and procedure verified Lesion destroyed using liquid nitrogen: Yes   Region frozen until ice  ball extended beyond lesion: Yes   Outcome: patient tolerated procedure well with no complications   Post-procedure details: wound care instructions given    Tinea cruris groin Chronic and  persistent Patient complains of itching in groin.  Start Ketoconazole 2 % cream apply topically to affected area of groin at bedtime for itch.  ketoconazole (NIZORAL) 2 % cream - groin Apply 1 application topically at bedtime. Apply to affected area for itch at groin  Skin cancer screening  Lentigines - Scattered tan macules - Due to sun exposure - Benign-appering, observe - Recommend daily broad spectrum sunscreen SPF 30+ to sun-exposed areas, reapply every 2 hours as needed. - Call for any changes  Seborrheic Keratoses - Stuck-on, waxy, tan-brown papules and/or plaques  - Benign-appearing - Discussed benign etiology and prognosis. - Observe - Call for any changes  Melanocytic Nevi - Tan-brown and/or pink-flesh-colored symmetric macules and papules - Benign appearing on exam today - Observation - Call clinic for new or changing moles - Recommend daily use of broad spectrum spf 30+ sunscreen to sun-exposed areas.   Hemangiomas - Red papules - Discussed benign nature - Observe - Call for any changes  Actinic Damage - Severe, confluent actinic changes with pre-cancerous actinic keratoses  - Severe, chronic, not at goal, secondary to cumulative UV radiation exposure over time - diffuse scaly erythematous macules and papules with underlying dyspigmentation - Discussed Prescription "Field Treatment" for Severe, Chronic Confluent Actinic Changes with Pre-Cancerous Actinic Keratoses at frontal scalp  Field treatment involves treatment of an entire area of skin that has confluent Actinic Changes (Sun/ Ultraviolet light damage) and PreCancerous Actinic Keratoses by method of PhotoDynamic Therapy (PDT) and/or prescription Topical Chemotherapy agents such as 5-fluorouracil, 5-fluorouracil/calcipotriene, and/or imiquimod.  The purpose is to decrease the number of clinically evident and subclinical PreCancerous lesions to prevent progression to development of skin cancer by chemically  destroying early precancer changes that may or may not be visible.  It has been shown to reduce the risk of developing skin cancer in the treated area. As a result of treatment, redness, scaling, crusting, and open sores may occur during treatment course. One or more than one of these methods may be used and may have to be used several times to control, suppress and eliminate the PreCancerous changes. Discussed treatment course, expected reaction, and possible side effects. - Recommend daily broad spectrum sunscreen SPF 30+ to sun-exposed areas, reapply every 2 hours as needed.  - Staying in the shade or wearing long sleeves, sun glasses (UVA+UVB protection) and wide brim hats (4-inch brim around the entire circumference of the hat) are also recommended. - Call for new or changing lesions.  Skin cancer screening performed today.  Return in about 1 year (around 10/14/2021) for tbse. IRuthell Rummage, CMA, am acting as scribe for Sarina Ser, MD. Documentation: I have reviewed the above documentation for accuracy and completeness, and I agree with the above.  Sarina Ser, MD

## 2020-10-14 NOTE — Patient Instructions (Addendum)
Wound Care Instructions  Cleanse wound gently with soap and water once a day then pat dry with clean gauze. Apply a thing coat of Petrolatum (petroleum jelly, "Vaseline") over the wound (unless you have an allergy to this). We recommend that you use a new, sterile tube of Vaseline. Do not pick or remove scabs. Do not remove the yellow or white "healing tissue" from the base of the wound.  Cover the wound with fresh, clean, nonstick gauze and secure with paper tape. You may use Band-Aids in place of gauze and tape if the would is small enough, but would recommend trimming much of the tape off as there is often too much. Sometimes Band-Aids can irritate the skin.  You should call the office for your biopsy report after 1 week if you have not already been contacted.  If you experience any problems, such as abnormal amounts of bleeding, swelling, significant bruising, significant pain, or evidence of infection, please call the office immediately.  FOR ADULT SURGERY PATIENTS: If you need something for pain relief you may take 1 extra strength Tylenol (acetaminophen) AND 2 Ibuprofen ('200mg'$  each) together every 4 hours as needed for pain. (do not take these if you are allergic to them or if you have a reason you should not take them.) Typically, you may only need pain medication for 1 to 3 days.    Actinic keratoses are precancerous spots that appear secondary to cumulative UV radiation exposure/sun exposure over time. They are chronic with expected duration over 1 year. A portion of actinic keratoses will progress to squamous cell carcinoma of the skin. It is not possible to reliably predict which spots will progress to skin cancer and so treatment is recommended to prevent development of skin cancer.  Recommend daily broad spectrum sunscreen SPF 30+ to sun-exposed areas, reapply every 2 hours as needed.  Recommend staying in the shade or wearing long sleeves, sun glasses (UVA+UVB protection) and wide  brim hats (4-inch brim around the entire circumference of the hat). Call for new or changing lesions.   Doxycycline should be taken with food to prevent nausea. Do not lay down for 30 minutes after taking. Be cautious with sun exposure and use good sun protection while on this medication. Pregnant women should not take this medication.   Cryotherapy Aftercare  Wash gently with soap and water everyday.   Apply Vaseline and Band-Aid daily until healed.   5-Fluorouracil/Calcipotriene Patient Education   Actinic keratoses are the dry, red scaly spots on the skin caused by sun damage. A portion of these spots can turn into skin cancer with time, and treating them can help prevent development of skin cancer.   Treatment of these spots requires removal of the defective skin cells. There are various ways to remove actinic keratoses, including freezing with liquid nitrogen, treatment with creams, or treatment with a blue light procedure in the office.   5-fluorouracil cream is a topical cream used to treat actinic keratoses. It works by interfering with the growth of abnormal fast-growing skin cells, such as actinic keratoses. These cells peel off and are replaced by healthy ones.   5-fluorouracil/calcipotriene is a combination of the 5-fluorouracil cream with a vitamin D analog cream called calcipotriene. The calcipotriene alone does not treat actinic keratoses. However, when it is combined with 5-fluorouracil, it helps the 5-fluorouracil treat the actinic keratoses much faster so that the same results can be achieved with a much shorter treatment time.  INSTRUCTIONS FOR 5-FLUOROURACIL/CALCIPOTRIENE CREAM:  for  front Scalp  5-fluorouracil/calcipotriene cream typically only needs to be used for 4-7 days. A thin layer should be applied twice a day to the treatment areas recommended by your physician.   If your physician prescribed you separate tubes of 5-fluourouracil and calcipotriene, apply a thin  layer of 5-fluorouracil followed by a thin layer of calcipotriene.   Avoid contact with your eyes, nostrils, and mouth. Do not use 5-fluorouracil/calcipotriene cream on infected or open wounds.   You will develop redness, irritation and some crusting at areas where you have pre-cancer damage/actinic keratoses. IF YOU DEVELOP PAIN, BLEEDING, OR SIGNIFICANT CRUSTING, STOP THE TREATMENT EARLY - you have already gotten a good response and the actinic keratoses should clear up well.  Wash your hands after applying 5-fluorouracil 5% cream on your skin.   A moisturizer or sunscreen with a minimum SPF 30 should be applied each morning.   Once you have finished the treatment, you can apply a thin layer of Vaseline twice a day to irritated areas to soothe and calm the areas more quickly. If you experience significant discomfort, contact your physician.  For some patients it is necessary to repeat the treatment for best results.  SIDE EFFECTS: When using 5-fluorouracil/calcipotriene cream, you may have mild irritation, such as redness, dryness, swelling, or a mild burning sensation. This usually resolves within 2 weeks. The more actinic keratoses you have, the more redness and inflammation you can expect during treatment. Eye irritation has been reported rarely. If this occurs, please let us know.  If you have any trouble using this cream, please call the office. If you have any other questions about this information, please do not hesitate to ask me before you leave the office.   Melanoma ABCDEs  Melanoma is the most dangerous type of skin cancer, and is the leading cause of death from skin disease.  You are more likely to develop melanoma if you: Have light-colored skin, light-colored eyes, or red or blond hair Spend a lot of time in the sun Tan regularly, either outdoors or in a tanning bed Have had blistering sunburns, especially during childhood Have a close family member who has had a  melanoma Have atypical moles or large birthmarks  Early detection of melanoma is key since treatment is typically straightforward and cure rates are extremely high if we catch it early.   The first sign of melanoma is often a change in a mole or a new dark spot.  The ABCDE system is a way of remembering the signs of melanoma.  A for asymmetry:  The two halves do not match. B for border:  The edges of the growth are irregular. C for color:  A mixture of colors are present instead of an even brown color. D for diameter:  Melanomas are usually (but not always) greater than 56m - the size of a pencil eraser. E for evolution:  The spot keeps changing in size, shape, and color.  Please check your skin once per month between visits. You can use a small mirror in front and a large mirror behind you to keep an eye on the back side or your body.   If you see any new or changing lesions before your next follow-up, please call to schedule a visit.  Please continue daily skin protection including broad spectrum sunscreen SPF 30+ to sun-exposed areas, reapplying every 2 hours as needed when you're outdoors.   Staying in the shade or wearing long sleeves, sun glasses (UVA+UVB protection) and  wide brim hats (4-inch brim around the entire circumference of the hat) are also recommended for sun protection.    If you have any questions or concerns for your doctor, please call our main line at [phone] and press option 4 to reach your doctor's medical assistant. If no one answers, please leave a voicemail as directed and we will return your call as soon as possible. Messages left after 4 pm will be answered the following business day.   You may also send [place] a message via [place]. We typically respond to MyChart messages within 1-2 business days.  For prescription refills, please ask your pharmacy to contact our office. Our fax number is [phone].  If you have an urgent issue when the clinic is closed  that cannot wait until the next business day, you can page your doctor at the number below.    Please note that while we do our best to be available for urgent issues outside of office hours, we are not available 24/7.   If you have an urgent issue and are unable to reach [place], you may choose to seek medical care at your doctor's office, retail clinic, urgent care center, or emergency room.  If you have a medical emergency, please immediately call 911 or go to the emergency department.  Pager Numbers  - Dr. Nehemiah Massed: [phone]  - Dr. Laurence Ferrari: [phone]  - Dr. Nicole Kindred: [phone]  In the event of inclement weather, please call our main line at [phone] for an update on the status of any delays or closures.  Dermatology Medication Tips: Please keep the boxes that topical medications come in in order to help keep track of the instructions about where and how to use these. Pharmacies typically print the medication instructions only on the boxes and not directly on the medication tubes.   If your medication is too expensive, please contact our office at [phone] option 4 or send [place] a message through [place].   We are unable to tell what your co-pay for medications will be in advance as this is different depending on your insurance coverage. However, we may be able to find a substitute medication at lower cost or fill out paperwork to get insurance to cover a needed medication.   If a prior authorization is required to get your medication covered by your insurance company, please allow [place] 1-2 business days to complete this process.  Drug prices often vary depending on where the prescription is filled and some pharmacies may offer cheaper prices.  The website www.goodrx.com contains coupons for medications through different pharmacies. The prices here do not account for what the cost may be with help from insurance (it may be cheaper with your insurance), but the website can give you  the price if you did not use any insurance.  - You can print the associated coupon and take it with your prescription to the pharmacy.  - You may also stop by our office during regular business hours and pick up a GoodRx coupon card.  - If you need your prescription sent electronically to a different pharmacy, notify our office through [facility] or by phone at [phone] option 4.  Instructions for [facility] Medications  One or more of your medications was sent to the [facility] mail order compounding pharmacy. You will receive an email from them and can purchase the medicine through that link. It will then be mailed to your home at the address you confirmed. If for any  reason you do not receive an email from them, please check your spam folder. If you still do not find the email, please let us know. [facility] phone number is [phone].

## 2020-10-19 ENCOUNTER — Encounter: Payer: Self-pay | Admitting: Dermatology

## 2021-04-02 ENCOUNTER — Ambulatory Visit: Payer: Medicare Other | Admitting: Dermatology

## 2021-04-24 ENCOUNTER — Ambulatory Visit: Payer: Medicare Other | Admitting: Dermatology

## 2021-05-12 ENCOUNTER — Ambulatory Visit: Payer: Medicare Other | Admitting: Dermatology

## 2021-10-06 ENCOUNTER — Ambulatory Visit: Payer: Medicare Other | Admitting: Dermatology

## 2021-10-20 ENCOUNTER — Ambulatory Visit: Payer: Medicare Other | Admitting: Dermatology
